# Patient Record
Sex: Male | Born: 2000 | Race: White | Hispanic: No | Marital: Single
Health system: Southern US, Community
[De-identification: ages and names within clinical notes are randomized; demographics above are authoritative.]

---
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
## 2018-11-22 ENCOUNTER — Emergency Department (HOSPITAL_COMMUNITY)
Admission: EM | Admit: 2018-11-22 | Discharge: 2018-11-22 | Disposition: A | Discharge status: Home / Self Care | Payer: Self-pay | Attending: Emergency Medicine | Admitting: Emergency Medicine

## 2018-11-22 ENCOUNTER — Other Ambulatory Visit: Payer: Self-pay

## 2018-11-22 DIAGNOSIS — L03115 Cellulitis of right lower limb: Secondary | ICD-10-CM | POA: Insufficient documentation

## 2018-11-22 MED ORDER — DOXYCYCLINE HYCLATE 100 MG PO CAPS: 100.0000 mg | ORAL_CAPSULE | Freq: Two times a day (BID) | ORAL | 0 refills | Status: AC

## 2018-11-22 NOTE — ED Provider Notes (Signed)
[facility]   CSN: [id number] Arrival date & time: 11/22/18  1343   History   Chief Complaint No chief complaint on file.   HPI Marcus SeedsBrandon Robles is a 18 y.o. male.      HPI   18 year old male presents today with complaints of right knee pain.  Patient notes midday yesterday he noticed pain and swelling to the right anterior knee.  He notes no significant pain with lection extension but notes pain with going up stairs.  Denies any trauma, but does note he works in [employer].  He has been using ice and ibuprofen.  Denies any fevers, denies any wounds, no history of significant skin infections.      No past medical history on file.  There are no active problems to display for this patient.   No past surgical history on file.      Home Medications    Prior to Admission medications   Medication Sig Start Date End Date Taking? Authorizing Provider  doxycycline (VIBRAMYCIN) 100 MG capsule Take 1 capsule (100 mg total) by mouth 2 (two) times daily. 11/22/18   Eyvonne MechanicHedges, Jd Mccaster, PA-C    Family History No family history on file.  Social History Social History   Tobacco Use  . Smoking status: Not on file  Substance Use Topics  . Alcohol use: Not on file  . Drug use: Not on file     Allergies   Patient has no known allergies.   Review of Systems Review of Systems  All other systems reviewed and are negative.    Physical Exam Updated Vital Signs BP 137/66 (BP Location: Right Arm)   Pulse 86   Temp 98.5 F (36.9 C) (Oral)   Resp 14   SpO2 99%   Physical Exam Vitals signs and nursing note reviewed.  Constitutional:      Appearance: He is well-developed.  HENT:     Head: Normocephalic and atraumatic.  Eyes:     General: No scleral icterus.       Right eye: No discharge.        Left eye: No discharge.     Conjunctiva/sclera: Conjunctivae normal.     Pupils: Pupils are equal, round, and reactive to  light.  Neck:     Musculoskeletal: Normal range of motion.     Vascular: No JVD.     Trachea: No tracheal deviation.  Pulmonary:     Effort: Pulmonary effort is normal.     Breath sounds: No stridor.  Musculoskeletal:     Comments: Minor erythema noted to the right anterior knee, this does not extend past the anterior aspect of the knee, he has no fluctuance or palpable bursa-joint lines nontender knee full active pain-free range of motion-no obvious wounds  Neurological:     Mental Status: He is alert and oriented to person, place, and time.     Coordination: Coordination normal.  Psychiatric:        Behavior: Behavior normal.        Thought Content: Thought content normal.        Judgment: Judgment normal.      ED Treatments / Results  Labs (all labs ordered are listed, but only abnormal results are displayed) Labs Reviewed - No data to display  EKG None  Radiology No results found.  Procedures Procedures (including critical care time)  Medications Ordered in ED Medications - No data to display   Initial Impression / Assessment and  Plan / ED Course  I have reviewed the triage vital signs and the nursing notes.  Pertinent labs & imaging results that were available during my care of the patient were reviewed by me and considered in my medical decision making (see chart for details).        18 year old male presents today with likely cellulitis.  This is over the anterior aspect of his knee gives concern for bursitis.  He has no significant swelling that would indicate bursitis.  I do feel that treatment with antibiotics for cellulitis is indicated at this time.  He has no signs of septic arthritis or severe cellulitis.  Patient discharged with strict return precautions, he will return in 2 days if symptoms have not improved, he verbalized understanding and agreement to today's plan had no further questions or concerns.  Final Clinical Impressions(s) / ED Diagnoses    Final diagnoses:  Cellulitis of right lower extremity    ED Discharge Orders         Ordered    doxycycline (VIBRAMYCIN) 100 MG capsule  2 times daily     11/22/18 1612           Okey Regal, PA-C 11/23/18 1423    Blanchie Dessert, MD 11/25/18 2154

## 2018-11-22 NOTE — ED Triage Notes (Signed)
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

## 2018-11-22 NOTE — Discharge Instructions (Addendum)
Please read attached information. If you experience any new or worsening signs or symptoms please return to the emergency room for evaluation. Please follow-up with your primary care provider or specialist as discussed. Please use medication prescribed only as directed and discontinue taking if you have any concerning signs or symptoms.   °
